# Patient Record
Sex: Female | Born: 1955 | Hispanic: No | Marital: Married | Smoking: Never smoker
Health system: Southern US, Community
[De-identification: ages and names within clinical notes are randomized; demographics above are authoritative.]

## PROBLEM LIST (undated history)

## (undated) DIAGNOSIS — M79602 Pain in left arm: Secondary | ICD-10-CM

## (undated) DIAGNOSIS — A689 Relapsing fever, unspecified: Secondary | ICD-10-CM

## (undated) DIAGNOSIS — G43909 Migraine, unspecified, not intractable, without status migrainosus: Secondary | ICD-10-CM

## (undated) HISTORY — DX: Pain in left arm: M79.602

## (undated) HISTORY — DX: Relapsing fever, unspecified: A68.9

## (undated) HISTORY — DX: Migraine, unspecified, not intractable, without status migrainosus: G43.909

---

## 2007-03-12 ENCOUNTER — Encounter: Admission: RE | Admit: 2007-03-12 | Discharge: 2007-03-12 | Payer: Self-pay | Admitting: Cardiovascular Disease

## 2010-09-02 ENCOUNTER — Ambulatory Visit
Admission: RE | Admit: 2010-09-02 | Discharge: 2010-09-02 | Disposition: A | Discharge status: Home / Self Care | Payer: 59 | Source: Ambulatory Visit | Attending: Cardiovascular Disease | Admitting: Cardiovascular Disease

## 2010-09-02 ENCOUNTER — Other Ambulatory Visit: Payer: Self-pay | Admitting: Cardiovascular Disease

## 2010-09-02 DIAGNOSIS — R05 Cough: Secondary | ICD-10-CM

## 2011-09-25 ENCOUNTER — Other Ambulatory Visit: Payer: Self-pay | Admitting: Cardiovascular Disease

## 2011-09-25 ENCOUNTER — Ambulatory Visit
Admission: RE | Admit: 2011-09-25 | Discharge: 2011-09-25 | Disposition: A | Discharge status: Home / Self Care | Payer: 59 | Source: Ambulatory Visit | Attending: Cardiovascular Disease | Admitting: Cardiovascular Disease

## 2011-09-25 DIAGNOSIS — R52 Pain, unspecified: Secondary | ICD-10-CM

## 2011-09-25 DIAGNOSIS — R200 Anesthesia of skin: Secondary | ICD-10-CM

## 2011-11-02 ENCOUNTER — Other Ambulatory Visit: Payer: Self-pay | Admitting: Cardiovascular Disease

## 2011-11-02 ENCOUNTER — Ambulatory Visit
Admission: RE | Admit: 2011-11-02 | Discharge: 2011-11-02 | Disposition: A | Discharge status: Home / Self Care | Payer: 59 | Source: Ambulatory Visit | Attending: Cardiovascular Disease | Admitting: Cardiovascular Disease

## 2011-11-02 DIAGNOSIS — R509 Fever, unspecified: Secondary | ICD-10-CM

## 2011-12-01 DIAGNOSIS — M79602 Pain in left arm: Secondary | ICD-10-CM | POA: Insufficient documentation

## 2011-12-01 DIAGNOSIS — G43909 Migraine, unspecified, not intractable, without status migrainosus: Secondary | ICD-10-CM | POA: Insufficient documentation

## 2011-12-01 DIAGNOSIS — A689 Relapsing fever, unspecified: Secondary | ICD-10-CM | POA: Insufficient documentation

## 2011-12-04 ENCOUNTER — Ambulatory Visit (INDEPENDENT_AMBULATORY_CARE_PROVIDER_SITE_OTHER): Payer: 59 | Admitting: Internal Medicine

## 2011-12-04 ENCOUNTER — Encounter: Payer: Self-pay | Admitting: Internal Medicine

## 2011-12-04 VITALS — BP 122/83 | HR 56 | Temp 97.8°F | Ht 64.0 in | Wt 131.2 lb

## 2011-12-04 DIAGNOSIS — K921 Melena: Secondary | ICD-10-CM

## 2011-12-04 DIAGNOSIS — M2550 Pain in unspecified joint: Secondary | ICD-10-CM

## 2011-12-04 DIAGNOSIS — K5900 Constipation, unspecified: Secondary | ICD-10-CM

## 2011-12-04 DIAGNOSIS — A920 Chikungunya virus disease: Secondary | ICD-10-CM | POA: Insufficient documentation

## 2011-12-04 DIAGNOSIS — R634 Abnormal weight loss: Secondary | ICD-10-CM

## 2011-12-04 DIAGNOSIS — A928 Other specified mosquito-borne viral fevers: Secondary | ICD-10-CM

## 2011-12-04 MED ORDER — NAPROXEN 250 MG PO TABS: 250.0000 mg | ORAL_TABLET | Freq: Two times a day (BID) | ORAL | Status: AC

## 2011-12-04 NOTE — Progress Notes (Signed)
Patient ID: Taylor Crawford, female   DOB: 1955/11/27, 56 y.o.   MRN: [id number]    [facility]  Reason for Consult: Fever and joint pain  Re andferring Physician:  Dr. Orpah Cobb  Patient Active Problem List  Diagnosis  . Recurrent fever  . Migraine  . Pain of left arm  . Polyarthralgia  . Constipation  . Weight loss, unintentional  . Hematochezia  . Chikungunya fever    Patient's Medications  New Prescriptions   NAPROXEN (NAPROSYN) 250 MG TABLET    Take 1 tablet (250 mg total) by mouth 2 (two) times daily with a meal.  Previous Medications   No medications on file  Modified Medications   No medications on file  Discontinued Medications   CYCLOBENZAPRINE (FLEXERIL) 5 MG TABLET    Take 5 mg by mouth 2 (two) times daily as needed.   IBUPROFEN (ADVIL,MOTRIN) 200 MG TABLET    Take 200 mg by mouth 2 (two) times daily.    Recommendations: 1. Retry Naprosyn   Assessment:  I cannot be actually certain but it is possible that her recent joint symptoms are delayed effects of having had Chikungunya fever. I told her that it is quite common to have chronic intermittent joint discomfort. I do not see anything to suggest septic arthritis, or rotator cuff injury. I suggested that she retry the Naprosyn since it seemed to give her the greatest relief. If she has any further problems tolerating it I suggested that she mention that to Dr. Algie Coffer, especially if she feels she's having any problems with GI bleeding. I'm not sure if she has actually had any real fever recently but I do not see any evidence of malaria or other ongoing infections. I do not have documentation that she has actually lost weight but this should be monitored closely.   HPI: Taylor Crawford is a 56 y.o. female  who states that she was in good health before traveling to her native [place] last October. Toward the tail end of her trip in December she developed sudden onset of fever and severe  joint pain in her hands, elbows, shoulders, hips and knees. She was seen at a local medical clinic and was told that she had Chikungunya fever. She's not sure if this was diagnosed clinically oral with blood work. Throughout my exam today I have some difficulty following the temporal sequence of her illness despite multiple attempts by her husband and son to communicate with her.  She was given some herbal powder to mix with water and drink for her symptoms. It seems that her symptoms resolved fairly promptly so she did not use the powder until she started having some joint pain in March. It sounds like she used it for about one month but then became concerned that it was causing her to lose weight so she stopped it. She started having more pain in her hands and left shoulder and started taking Naprosyn. That helped with her pain but after 2 weeks she think she saw a small amount of red blood in her stool and became concerned and stopped the Naprosyn. She states that she was suffering from constipation at that time.  She saw Dr. Algie Coffer on May 16. She mentioned having some recurrent fever. He ordered a CBC, malaria smear and chest x-ray all of which were completely normal. He gave her a prescription for Flexeril. She states that that did not help with her pain so she stopped it  and started taking ibuprofen. She has been taking it twice a day for the last 2 weeks. She has tolerated it well and states that it helps with her hand pain but not with her left shoulder pain. She denies having any recent fever.  She feels like she has a little bit of swelling in her hands. She denies having any changes compatible with Raynaud's phenomenon. She denies any family history of arthritis. She denies ever having had joint pains like this before. She denies any injury to her hands or shoulder.  Review of Systems: Pertinent items are noted in HPI.      Past Medical History  Diagnosis Date  . Recurrent fever   .  Migraine   . Pain of left arm     History  Substance Use Topics  . Smoking status: Never Smoker   . Smokeless tobacco: Never Used  . Alcohol Use: Not on file    No family history on file. No Known Allergies  OBJECTIVE: Blood pressure 122/83, pulse 56, temperature 97.8 F (36.6 C), temperature source Oral, height 5\' 4"  (1.626 m), weight 131 lb 4 oz (59.535 kg). General:  she states that she weighed 145 pounds when she left for [place] and is concerned that her weight is down. (Her husband and son states that she hasn't been avoiding certain foods because she thinks that sour or spicy food might be causing her symptoms) Skin:  no rash or palpable adenopathy Eyes: Normal external exam Oral: No oropharyngeal lesions Lungs:  clear Cor:  regular S1 and S2 with no murmurs Abdomen:  soft and nontender joints: She may have slight swelling of her MCP joints of her hands bilaterally. There is no redness or warmth. She has subjective tenderness with range of motion of her left shoulder. She is specifically tender over the trapezius posteriorly. She has no anterior shoulder tenderness, swelling or redness. Neuro: She has normal strength in all extremities  Microbiology: No results found for this or any previous visit (from the past 240 hour(s)).  Cliffton Asters, MD [facility] for Infect[facility] Disease [facility] [phone] pager   [phone] cell 12/04/2011, 12:18 PM

## 2012-01-11 ENCOUNTER — Other Ambulatory Visit: Payer: Self-pay | Admitting: Cardiovascular Disease

## 2012-01-11 DIAGNOSIS — M542 Cervicalgia: Secondary | ICD-10-CM

## 2012-01-11 DIAGNOSIS — R29898 Other symptoms and signs involving the musculoskeletal system: Secondary | ICD-10-CM

## 2012-01-15 ENCOUNTER — Ambulatory Visit
Admission: RE | Admit: 2012-01-15 | Discharge: 2012-01-15 | Disposition: A | Discharge status: Home / Self Care | Payer: 59 | Source: Ambulatory Visit | Attending: Cardiovascular Disease | Admitting: Cardiovascular Disease

## 2012-01-15 DIAGNOSIS — R29898 Other symptoms and signs involving the musculoskeletal system: Secondary | ICD-10-CM

## 2012-01-15 DIAGNOSIS — M542 Cervicalgia: Secondary | ICD-10-CM

## 2012-07-22 IMAGING — CR DG CHEST 2V
2 series · 2 of 2 positions shown · non-contrast
Comparison: None.

CLINICAL DATA: Chest pain, 4 days cough.

CHEST - 2 VIEW

[w chest pa]
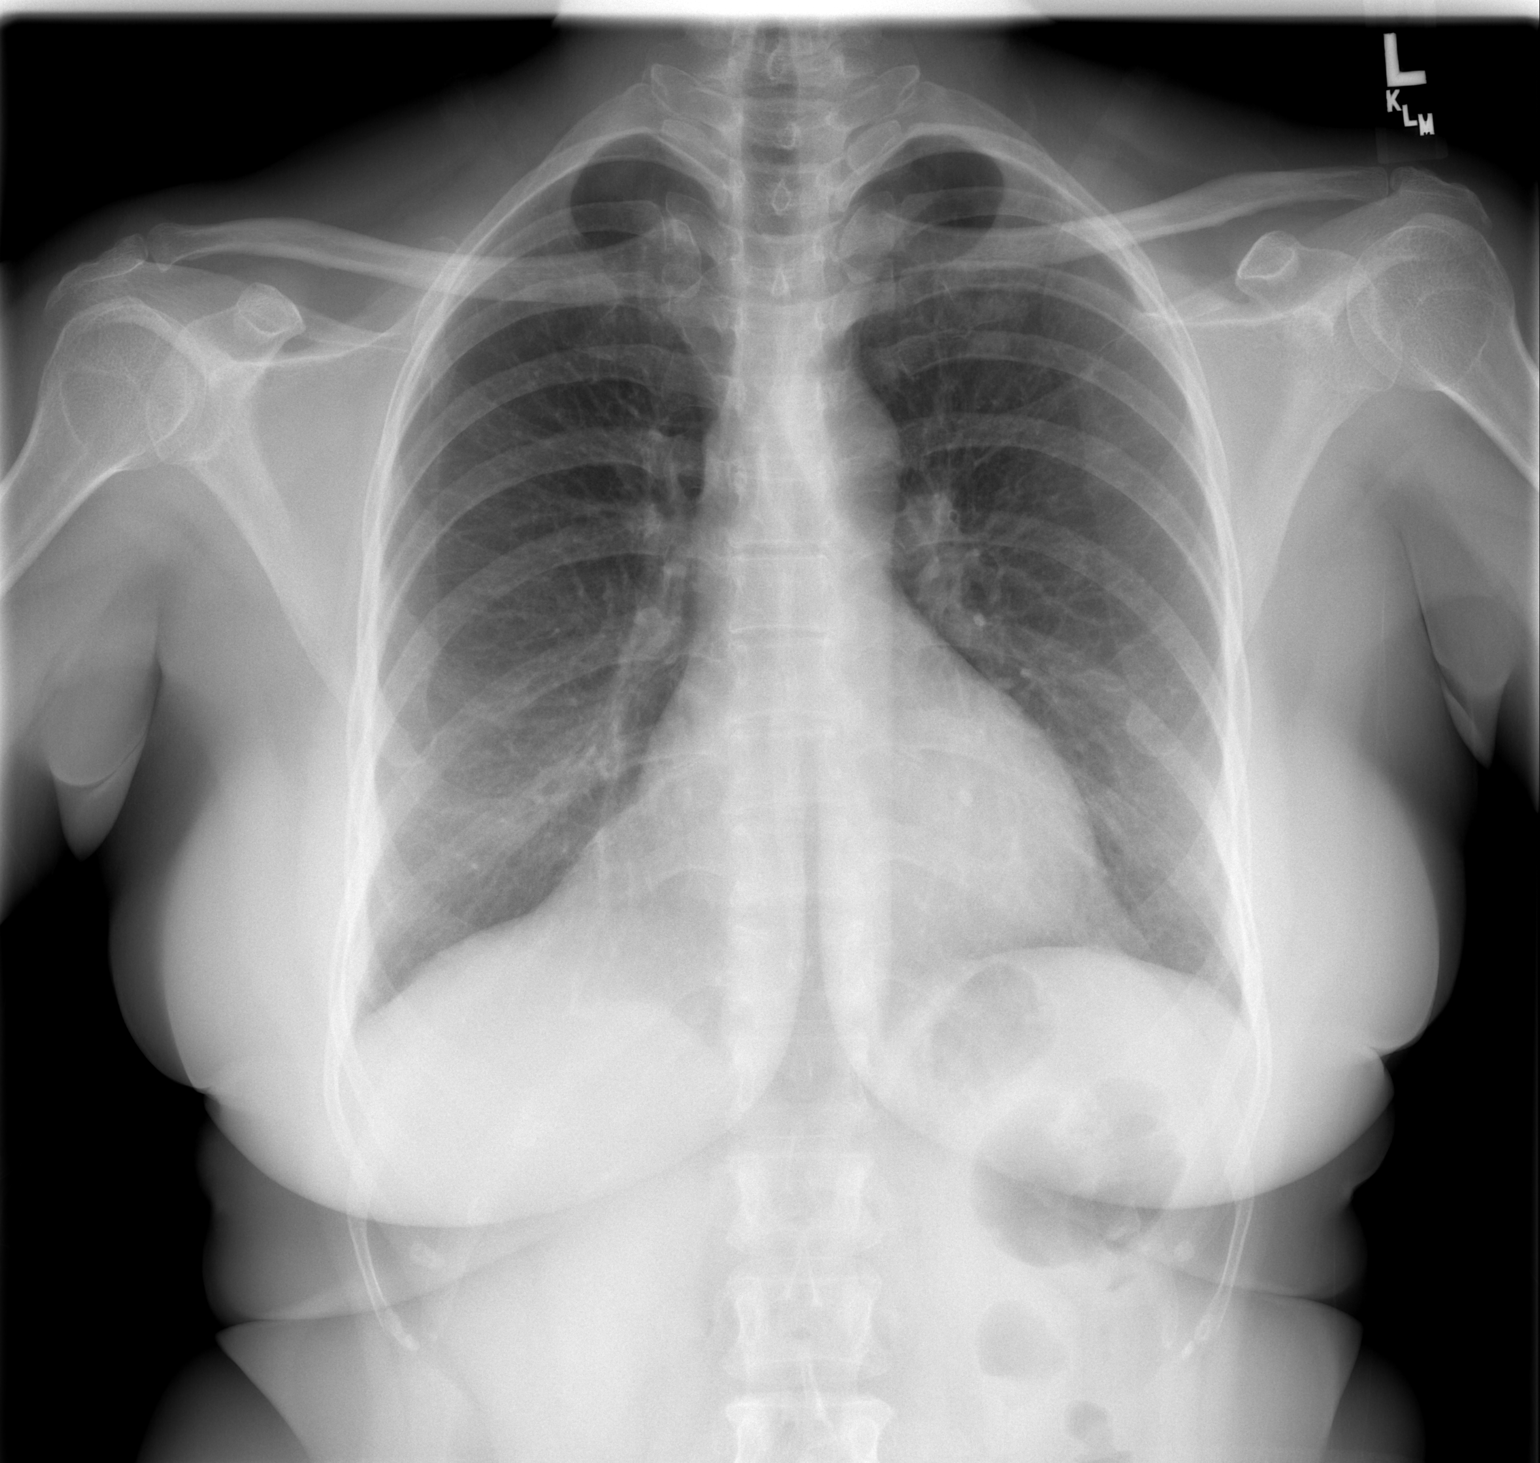

[w chest lat]
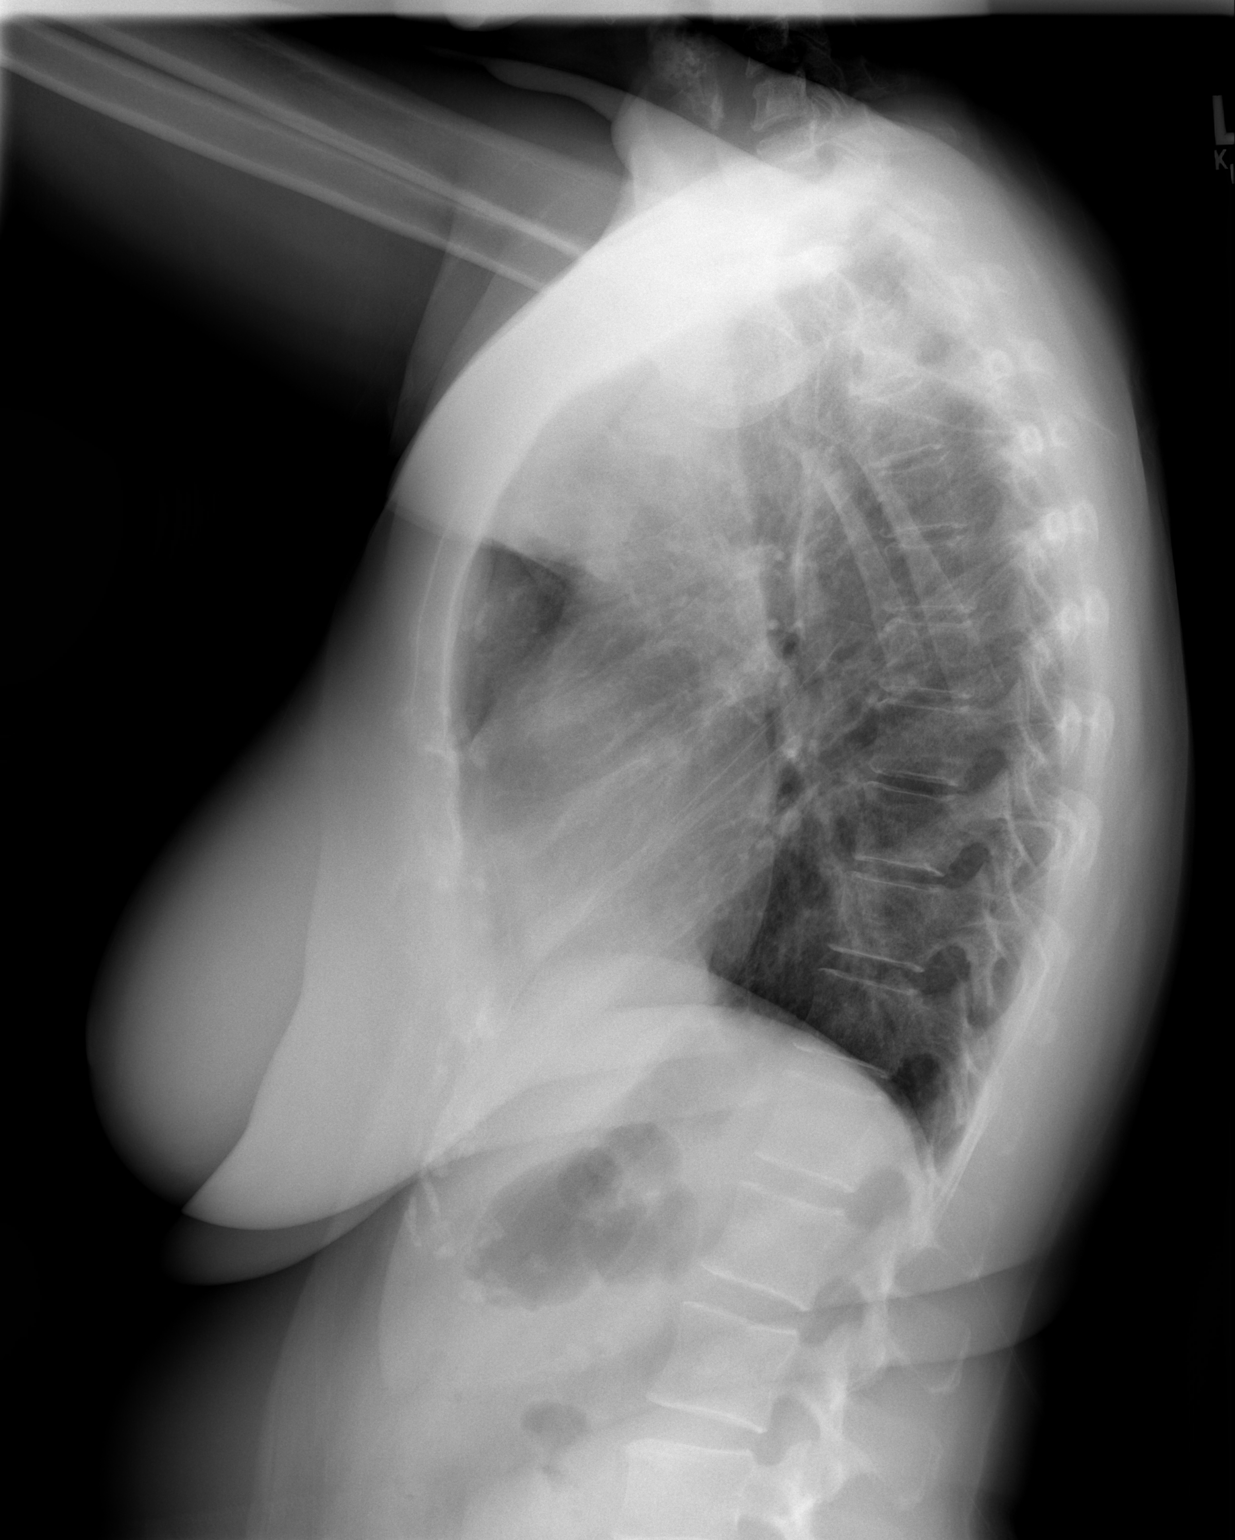

[2 of 2 positions shown; findings below may reference images not displayed]

FINDINGS: Lungs are clear.  Anatomic variant prominent right
cardiophrenic fat pads seen.

Heart size normal.

Mediastinum, hila, pleura and osseous structures appear normal for
age.
IMPRESSION: No active cardiopulmonary disease.

## 2013-09-21 IMAGING — CR DG CHEST 2V
2 series · 2 of 2 positions shown · non-contrast
Comparison: Chest x-ray of 09/02/2010

CLINICAL DATA: Fever, cough, chills

CHEST - 2 VIEW

[w chest pa]
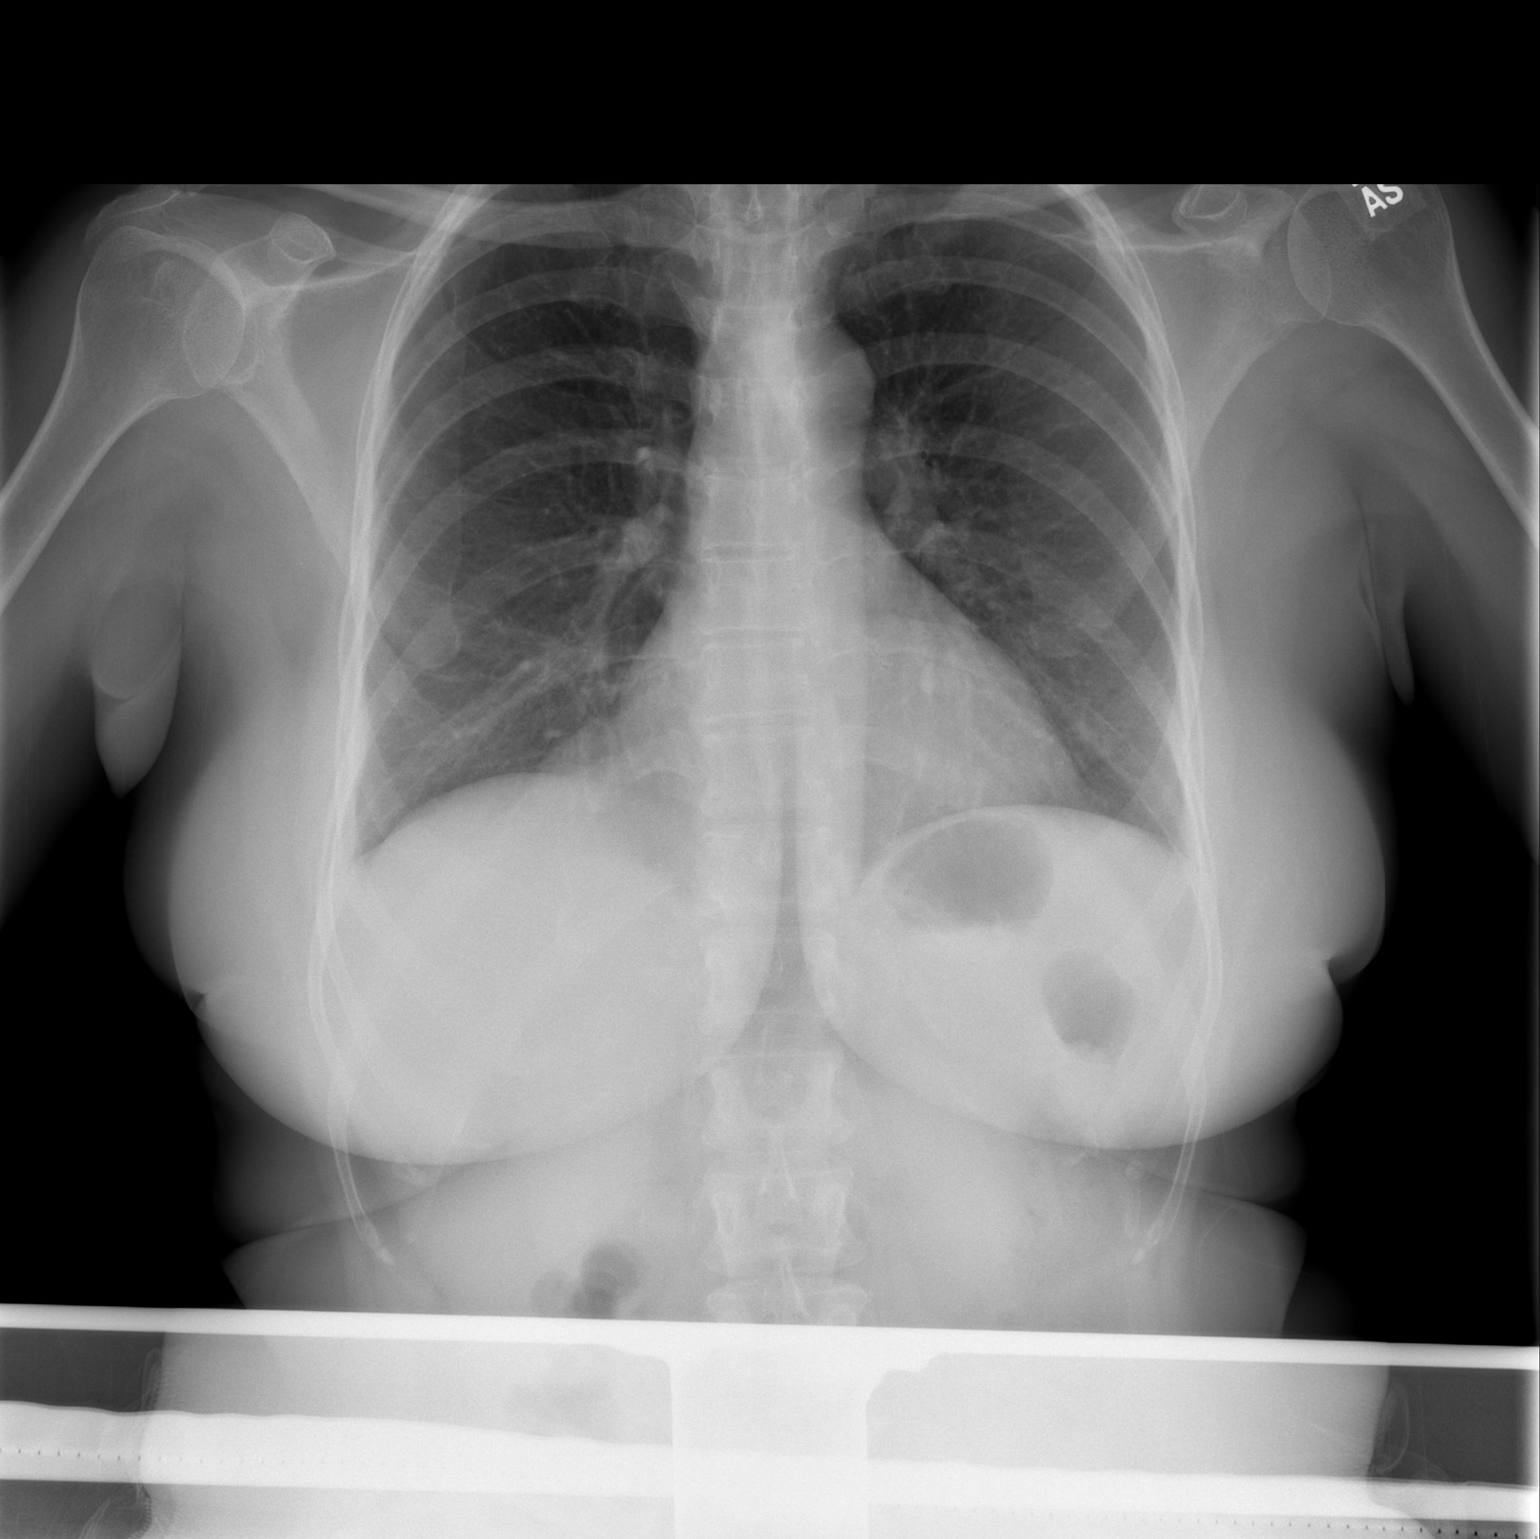

[w chest lat]
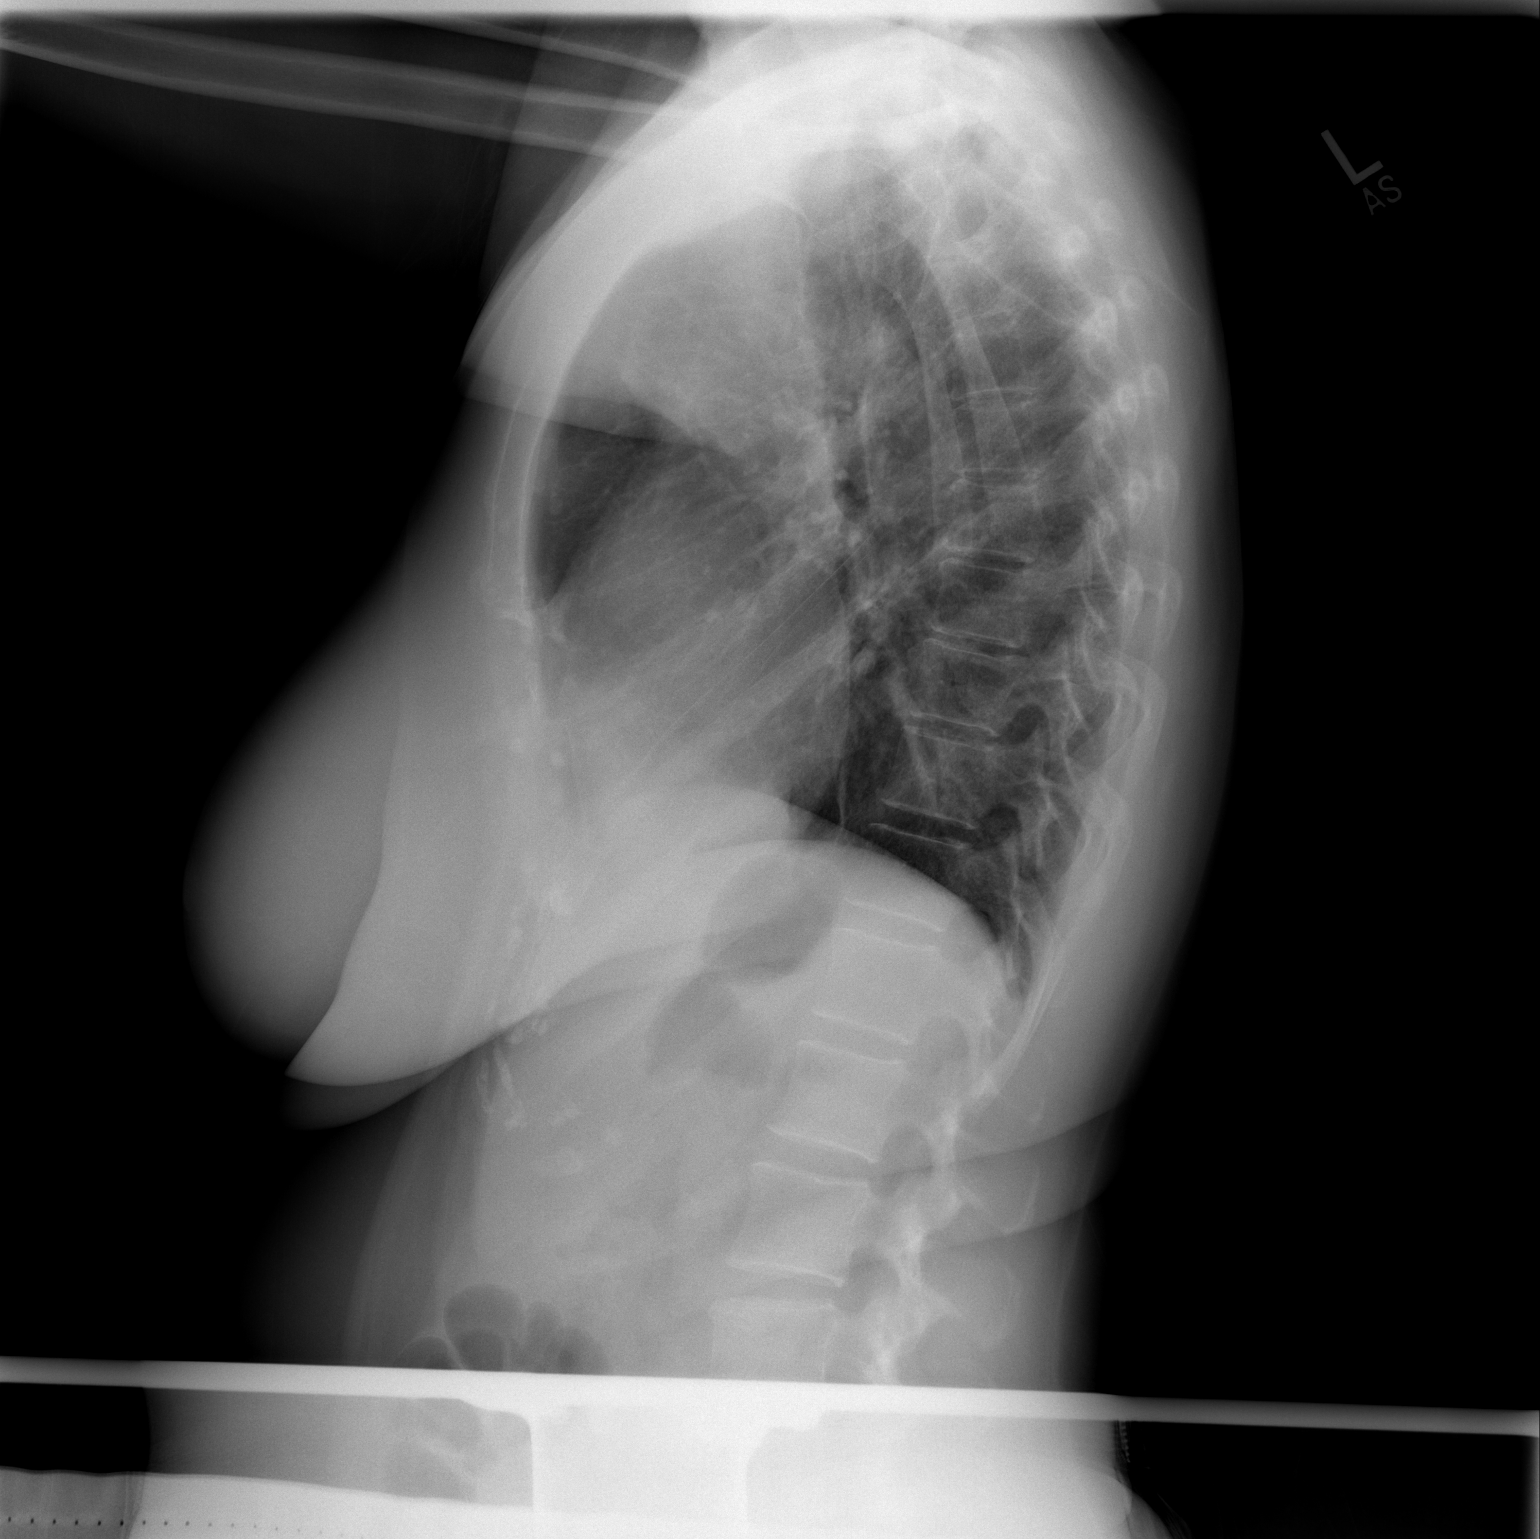

[2 of 2 positions shown; findings below may reference images not displayed]

FINDINGS: The lungs are clear.  Mediastinal contours appear normal.
The heart is within normal limits in size.  No bony abnormality is
seen.
IMPRESSION: No active lung disease.

## 2014-02-27 ENCOUNTER — Emergency Department (HOSPITAL_COMMUNITY)
Admission: EM | Admit: 2014-02-27 | Discharge: 2014-02-28 | Disposition: A | Discharge status: Home / Self Care | Payer: 59 | Attending: Emergency Medicine | Admitting: Emergency Medicine

## 2014-02-27 ENCOUNTER — Emergency Department (HOSPITAL_COMMUNITY): Payer: 59

## 2014-02-27 ENCOUNTER — Encounter (HOSPITAL_COMMUNITY): Payer: Self-pay | Admitting: Emergency Medicine

## 2014-02-27 DIAGNOSIS — Z8679 Personal history of other diseases of the circulatory system: Secondary | ICD-10-CM | POA: Diagnosis not present

## 2014-02-27 DIAGNOSIS — Y92009 Unspecified place in unspecified non-institutional (private) residence as the place of occurrence of the external cause: Secondary | ICD-10-CM | POA: Insufficient documentation

## 2014-02-27 DIAGNOSIS — Y9389 Activity, other specified: Secondary | ICD-10-CM | POA: Diagnosis not present

## 2014-02-27 DIAGNOSIS — Z23 Encounter for immunization: Secondary | ICD-10-CM | POA: Insufficient documentation

## 2014-02-27 DIAGNOSIS — W292XXA Contact with other powered household machinery, initial encounter: Secondary | ICD-10-CM | POA: Diagnosis not present

## 2014-02-27 DIAGNOSIS — S61209A Unspecified open wound of unspecified finger without damage to nail, initial encounter: Secondary | ICD-10-CM | POA: Diagnosis not present

## 2014-02-27 DIAGNOSIS — S62609B Fracture of unspecified phalanx of unspecified finger, initial encounter for open fracture: Secondary | ICD-10-CM

## 2014-02-27 MED ORDER — OXYCODONE-ACETAMINOPHEN 5-325 MG PO TABS: 1.0000 | ORAL_TABLET | ORAL | Status: AC | PRN

## 2014-02-27 MED ORDER — CEFAZOLIN SODIUM 1 G IJ SOLR
1.0000 g | Freq: Once | INTRAMUSCULAR | Status: AC
  Administered 2014-02-28: 1 g via INTRAMUSCULAR
  Filled 2014-02-27: qty 10

## 2014-02-27 MED ORDER — LIDOCAINE HCL 2 % IJ SOLN
10.0000 mL | Freq: Once | INTRAMUSCULAR | Status: AC
  Administered 2014-02-28: 200 mg
  Filled 2014-02-27: qty 20

## 2014-02-27 MED ORDER — OXYCODONE-ACETAMINOPHEN 5-325 MG PO TABS
1.0000 | ORAL_TABLET | Freq: Once | ORAL | Status: AC
Start: 2014-02-28 — End: 2014-02-28
  Administered 2014-02-28: 1 via ORAL
  Filled 2014-02-27: qty 1

## 2014-02-27 MED ORDER — TETANUS-DIPHTH-ACELL PERTUSSIS 5-2.5-18.5 LF-MCG/0.5 IM SUSP
0.5000 mL | Freq: Once | INTRAMUSCULAR | Status: AC
  Administered 2014-02-27: 0.5 mL via INTRAMUSCULAR
  Filled 2014-02-27: qty 0.5

## 2014-02-27 MED ORDER — OXYCODONE-ACETAMINOPHEN 5-325 MG PO TABS
1.0000 | ORAL_TABLET | Freq: Once | ORAL | Status: AC
  Administered 2014-02-27: 1 via ORAL
  Filled 2014-02-27: qty 1

## 2014-02-27 MED ORDER — CEPHALEXIN 500 MG PO CAPS: 500.0000 mg | ORAL_CAPSULE | Freq: Four times a day (QID) | ORAL | Status: AC

## 2014-02-27 NOTE — ED Provider Notes (Signed)
CSN: [id number]     Arrival date & time 02/27/14  1926 History  This chart was scribed for non-physician practitioner working with Taylor Shi, MD by Elveria Rising, ED Scribe. This patient was seen in room TR09C/TR09C and the patient's care was started at 9:34 PM.   Chief Complaint  Patient presents with  . Laceration    The history is provided by the patient. No language interpreter was used.   HPI Comments: Taylor Crawford is a 58 y.o. female who presents to the Emergency Department with lacerated right third finger, 2.5 hours ago. Patient reports cutting her finger with a blender blade that was running while cooking tonight. Patient has a laceration through the nail bed of her right third finger and small lacerations on her other fingers. Patient reports profuse bleeding and pain after the injury. Pain was constant and throbbing, moderate to severe, worse with palpation or movement, better after percocet in ED.  Patient is able to move the finger and denies numbness or decreased sensation. Patient has never had a Tetanus vaccination.  Denies other injury.   Past Medical History  Diagnosis Date  . Recurrent fever   . Migraine   . Pain of left arm    History reviewed. No pertinent past surgical history. No family history on file. History  Substance Use Topics  . Smoking status: Never Smoker   . Smokeless tobacco: Never Used  . Alcohol Use: No   OB History   Grav Para Term Preterm Abortions TAB SAB Ect Mult Living                 Review of Systems  Constitutional: Negative for fever.  Skin: Positive for wound. Negative for color change.  Allergic/Immunologic: Negative for immunocompromised state.  Neurological: Negative for weakness and numbness.  Hematological: Does not bruise/bleed easily.  Psychiatric/Behavioral: Positive for self-injury (accidental).      Allergies  Review of patient's allergies indicates no known allergies.  Home Medications   Prior to  Admission medications   Not on File   Triage Vitals: BP 130/71  Pulse 64  Temp(Src) 98 F (36.7 C)  Resp 18  Ht  (1.575 m)  Wt 133 lb (60.328 kg)  BMI 24.32 kg/m2  SpO2 98%  Physical Exam  Nursing note and vitals reviewed. Constitutional: She appears well-developed and well-nourished. No distress.  HENT:  Head: Normocephalic and atraumatic.  Eyes: Conjunctivae are normal.  Neck: Normal range of motion. Neck supple.  Pulmonary/Chest: Effort normal.  Musculoskeletal:  Right middle finger with complex injury to distal phalanx.  Large nearly circumferential laceration distal to the nail.  Laceration to the nail, diagonal, nail intact and aligned.  Multiple other small lacerations throughout the distal finger.  Distal sensation intact.  Continued oozing of blood.  Full AROM of finger.   Right right finger with superficial laceration, hemostatic.    Neurological: She is alert.  Skin: She is not diaphoretic.  Psychiatric: She has a normal mood and affect. Her behavior is normal. Thought content normal.    ED Course  Procedures (including critical care time)  LACERATION REPAIR Performed by: Trixie Dredge Consent: Verbal consent obtained. Risks and benefits: risks, benefits and alternatives were discussed Patient identity confirmed: provided demographic data Time out performed prior to procedure Prepped and Draped in normal sterile fashion Wound explored Laceration Location: right middle finger, distal tuft, complex, multiple lacerations Laceration Length: 7cm No Foreign Bodies seen or palpated Anesthesia: digital block Local anesthetic: lidocaine  2% no epinephrine Anesthetic total: 5 ml Irrigation method: syringe Amount of cleaning: extensive Skin closure: 5-0 vicryl Number of sutures or staples: 19 Technique: simple interrupted Patient tolerance: Patient tolerated the procedure well with no immediate complications.   COORDINATION OF CARE: 10:31 PM- Discussed  treatment plan with patient at bedside and patient agreed to plan.   Labs Review Labs Reviewed - No data to display  Imaging Review Dg Finger Middle Right  02/27/2014   CLINICAL DATA:  Laceration.  EXAM: RIGHT MIDDLE FINGER 2+V  COMPARISON:  None.  FINDINGS: Displaced distal tuft fracture consistent with an open fracture. The joint spaces are maintained. Mild to moderate DIP joint degenerative changes.  IMPRESSION: Displaced distal tuft fracture.   Electronically Signed   By: Loralie Champagne M.D.   On: 02/27/2014 21:51     EKG Interpretation None      Discussed patient and injury with Dr Mina Marble (hand).  He has reviewed the xray.  Discussed the nailbed repair - as the nail is broken but aligned he advised to leave the nail in place and not to remove it as the wound below is also likely aligned.  He does not have a preferred antibiotic.  He will see the patient in his office next week on Tuesday or Thursday.   MDM   Final diagnoses:  Open fracture of finger, initial encounter    Afebrile, nontoxic patient with complex injury to right middle finger on running blender blade.  Open fracture with one large nearly circumferential laceration, a nailbed laceration, and multiple small lacerations.  Neurovascularly intact. Wounds discussed with Dr Mina Marble and repaired by me in the ED.  Pt given ancef and percocet in the ED, Tdap given.   D/C home with percocet, keflex, hand surgery follow up next week.  Discussed result, findings, treatment, and follow up  with patient.  Pt given return precautions.  Pt verbalizes understanding and agrees with plan.       I personally performed the services described in this documentation, which was scribed in my presence. The recorded information has been reviewed and is accurate.    [place], PA-C 02/28/14 0104

## 2014-02-27 NOTE — ED Notes (Signed)
Small cuts on the ring and index fingers.  Bleeding controlled

## 2014-02-27 NOTE — ED Notes (Signed)
Lacerated  Rt middle finger tip on a blender blade at nhome while cooking.  The distal tip appears loose and the laceration circles her entire finger around her nail

## 2014-02-28 NOTE — Discharge Instructions (Signed)
Read the information below.  Use the prescribed medication as directed.  Please discuss all new medications with your pharmacist.  Do not take additional tylenol while taking the prescribed pain medication to avoid overdose.  You may return to the Emergency Department at any time for worsening condition or any new symptoms that concern you.  If you develop redness, swelling, pus draining from the wound, difficulty moving your finger, or fevers greater than 100.4, return to the ER immediately for a recheck.      Finger Fracture Fractures of fingers are breaks in the bones of the fingers. There are many types of fractures. There are different ways of treating these fractures. Your health care provider will discuss the best way to treat your fracture. CAUSES Traumatic injury is the main cause of broken fingers. These include:  Injuries while playing sports.  Workplace injuries.  Falls. RISK FACTORS Activities that can increase your risk of finger fractures include:  Sports.  Workplace activities that involve machinery.  A condition called osteoporosis, which can make your bones less dense and cause them to fracture more easily. SIGNS AND SYMPTOMS The main symptoms of a broken finger are pain and swelling within 15 minutes after the injury. Other symptoms include:  Bruising of your finger.  Stiffness of your finger.  Numbness of your finger.  Exposed bones (compound fracture) if the fracture is severe. DIAGNOSIS  The best way to diagnose a broken bone is with X-ray imaging. Additionally, your health care provider will use this X-ray image to evaluate the position of the broken finger bones.  TREATMENT  Finger fractures can be treated with:   Nonreduction--This means the bones are in place. The finger is splinted without changing the positions of the bone pieces. The splint is usually left on for about a week to 10 days. This will depend on your fracture and what your health care  provider thinks.  Closed reduction--The bones are put back into position without using surgery. The finger is then splinted.  Open reduction and internal fixation--The fracture site is opened. Then the bone pieces are fixed into place with pins or some type of hardware. This is seldom required. It depends on the severity of the fracture. HOME CARE INSTRUCTIONS   Follow your health care provider's instructions regarding activities, exercises, and physical therapy.  Only take over-the-counter or prescription medicines for pain, discomfort, or fever as directed by your health care provider. SEEK MEDICAL CARE IF: You have pain or swelling that limits the motion or use of your fingers. SEEK IMMEDIATE MEDICAL CARE IF:  Your finger becomes numb. MAKE SURE YOU:   Understand these instructions.  Will watch your condition.  Will get help right away if you are not doing well or get worse. Document Released: 09/17/2000 Document Revised: 03/26/2013 Document Reviewed: 01/15/2013 [facility] Patient Information 2015 [place], [place]. This information is not intended to replace advice given to you by your health care provider. Make sure you discuss any questions you have with your health care provider.

## 2014-02-28 NOTE — ED Notes (Signed)
No reaction to the med

## 2014-02-28 NOTE — ED Provider Notes (Signed)
Medical screening examination/treatment/procedure(s) were performed by non-physician practitioner and as supervising physician I was immediately available for consultation/collaboration.   Dean Wonder L Medora Roorda, MD 02/28/14 1040 

## 2014-10-21 ENCOUNTER — Other Ambulatory Visit: Payer: Self-pay | Admitting: Cardiovascular Disease

## 2014-10-21 ENCOUNTER — Ambulatory Visit
Admission: RE | Admit: 2014-10-21 | Discharge: 2014-10-21 | Disposition: A | Discharge status: Home / Self Care | Payer: 59 | Source: Ambulatory Visit | Attending: Cardiovascular Disease | Admitting: Cardiovascular Disease

## 2014-10-21 DIAGNOSIS — R053 Chronic cough: Secondary | ICD-10-CM

## 2014-10-21 DIAGNOSIS — R05 Cough: Secondary | ICD-10-CM
